# Patient Record
Sex: Female | Born: 1960
Health system: Southern US, Community
[De-identification: ages and names within clinical notes are randomized; demographics above are authoritative.]

## PROBLEM LIST (undated history)

## (undated) DIAGNOSIS — M792 Neuralgia and neuritis, unspecified: Secondary | ICD-10-CM

## (undated) DIAGNOSIS — I10 Essential (primary) hypertension: Secondary | ICD-10-CM

## (undated) DIAGNOSIS — E7800 Pure hypercholesterolemia, unspecified: Secondary | ICD-10-CM

## (undated) HISTORY — PX: FOOT SURGERY: SHX648

## (undated) HISTORY — PX: OTHER SURGICAL HISTORY: SHX169

## (undated) HISTORY — PX: ABDOMINAL HYSTERECTOMY: SHX81

## (undated) HISTORY — PX: NECK SURGERY: SHX720

---

## 2009-05-22 ENCOUNTER — Encounter: Admission: RE | Admit: 2009-05-22 | Discharge: 2009-05-22 | Payer: Self-pay | Admitting: Orthopedic Surgery

## 2009-06-05 ENCOUNTER — Encounter: Admission: RE | Admit: 2009-06-05 | Discharge: 2009-06-05 | Payer: Self-pay | Admitting: Orthopedic Surgery

## 2009-08-29 ENCOUNTER — Encounter: Admission: RE | Admit: 2009-08-29 | Discharge: 2009-08-29 | Payer: Self-pay | Admitting: Orthopedic Surgery

## 2010-04-27 ENCOUNTER — Encounter: Payer: Self-pay | Admitting: Orthopedic Surgery

## 2010-06-03 ENCOUNTER — Other Ambulatory Visit (HOSPITAL_COMMUNITY): Payer: Self-pay

## 2010-06-04 ENCOUNTER — Other Ambulatory Visit (HOSPITAL_COMMUNITY): Payer: Self-pay

## 2010-06-05 ENCOUNTER — Other Ambulatory Visit (HOSPITAL_COMMUNITY): Payer: Self-pay

## 2010-06-06 ENCOUNTER — Ambulatory Visit (HOSPITAL_COMMUNITY): Admission: RE | Admit: 2010-06-06 | Payer: Self-pay | Source: Ambulatory Visit | Admitting: Neurosurgery

## 2015-09-01 ENCOUNTER — Emergency Department (HOSPITAL_COMMUNITY)
Admission: EM | Admit: 2015-09-01 | Discharge: 2015-09-02 | Disposition: A | Discharge status: Home / Self Care | Payer: Medicare Other | Attending: Emergency Medicine | Admitting: Emergency Medicine

## 2015-09-01 ENCOUNTER — Encounter (HOSPITAL_COMMUNITY): Payer: Self-pay | Admitting: *Deleted

## 2015-09-01 ENCOUNTER — Emergency Department (HOSPITAL_COMMUNITY): Payer: Medicare Other

## 2015-09-01 DIAGNOSIS — Y999 Unspecified external cause status: Secondary | ICD-10-CM | POA: Insufficient documentation

## 2015-09-01 DIAGNOSIS — Z87891 Personal history of nicotine dependence: Secondary | ICD-10-CM | POA: Insufficient documentation

## 2015-09-01 DIAGNOSIS — Y9389 Activity, other specified: Secondary | ICD-10-CM | POA: Diagnosis not present

## 2015-09-01 DIAGNOSIS — S92352A Displaced fracture of fifth metatarsal bone, left foot, initial encounter for closed fracture: Secondary | ICD-10-CM | POA: Insufficient documentation

## 2015-09-01 DIAGNOSIS — Y929 Unspecified place or not applicable: Secondary | ICD-10-CM | POA: Diagnosis not present

## 2015-09-01 DIAGNOSIS — I10 Essential (primary) hypertension: Secondary | ICD-10-CM | POA: Diagnosis not present

## 2015-09-01 DIAGNOSIS — S99922A Unspecified injury of left foot, initial encounter: Secondary | ICD-10-CM | POA: Diagnosis present

## 2015-09-01 DIAGNOSIS — W109XXA Fall (on) (from) unspecified stairs and steps, initial encounter: Secondary | ICD-10-CM | POA: Diagnosis not present

## 2015-09-01 DIAGNOSIS — S92302A Fracture of unspecified metatarsal bone(s), left foot, initial encounter for closed fracture: Secondary | ICD-10-CM

## 2015-09-01 HISTORY — DX: Essential (primary) hypertension: I10

## 2015-09-01 HISTORY — DX: Pure hypercholesterolemia, unspecified: E78.00

## 2015-09-01 HISTORY — DX: Neuralgia and neuritis, unspecified: M79.2

## 2015-09-01 NOTE — ED Notes (Signed)
Pt fell hurting her left ankle; ankle is swollen

## 2015-09-02 ENCOUNTER — Emergency Department (HOSPITAL_COMMUNITY): Payer: Medicare Other

## 2015-09-02 MED ORDER — OXYCODONE-ACETAMINOPHEN 5-325 MG PO TABS: 1.0000 | ORAL_TABLET | ORAL | Status: DC | PRN

## 2015-09-02 MED ORDER — IBUPROFEN 800 MG PO TABS: 800.0000 mg | ORAL_TABLET | Freq: Three times a day (TID) | ORAL | Status: DC

## 2015-09-02 NOTE — ED Provider Notes (Signed)
CSN: [id number]     Arrival date & time 09/01/15  2304 History   First MD Initiated Contact with Patient 09/01/15 2358     Chief Complaint  Patient presents with  . Fall     (Consider location/radiation/quality/duration/timing/severity/associated sxs/prior Treatment) HPI  Stefanie Sanders is a 55 y.o. female who presents to the Emergency Department complaining of left ankle and foot pain of sudden onset earlier tonight.  She states that she was trying to break up a fight between two of her dogs and accidentally fell off a step, possibly twisting her ankle.  She reports immediate pain and swelling to the outer ankle and lateral foot.  Unable to bear weight due to pain.  She has applied ice packs with minimal relief.  She denies head injury, neck or back pain, numbness or weakness of the extremity.     Past Medical History  Diagnosis Date  . Hypertension   . Hypercholesteremia   . Neurological pain disorder    Past Surgical History  Procedure Laterality Date  . Foot surgery Left   . Abdominal hysterectomy     History reviewed. No pertinent family history. Social History  Substance Use Topics  . Smoking status: Former Research scientist (life sciences)  . Smokeless tobacco: None  . Alcohol Use: Yes     Comment: occasionally   OB History    No data available     Review of Systems  Constitutional: Negative for fever and chills.  Musculoskeletal: Positive for joint swelling and arthralgias (left ankle and foot pain). Negative for back pain and neck pain.  Skin: Negative for color change and wound.  Neurological: Negative for weakness and numbness.  All other systems reviewed and are negative.     Allergies  Other  Home Medications   Prior to Admission medications   Not on File   BP 180/86 mmHg  Pulse 83  Temp(Src) 98.1 F (36.7 C) (Oral)  Resp 20  Ht 5\' 6"  (1.676 m)  Wt 70.308 kg  BMI 25.03 kg/m2  SpO2 100% Physical Exam  Constitutional: She is oriented to person, place, and time. She  appears well-developed and well-nourished. No distress.  HENT:  Head: Normocephalic and atraumatic.  Cardiovascular: Normal rate, regular rhythm, normal heart sounds and intact distal pulses.   Pulmonary/Chest: Effort normal and breath sounds normal. No respiratory distress.  Musculoskeletal: She exhibits edema and tenderness.  ttp and moderate edema of the lateral left ankle and lateral foot.  Ecchymosis of the lateral foot. DP pulse is brisk,distal sensation intact.  No erythema, abrasion, or bony deformity.  Compartments soft.  Pt has full ROM of the left knee.    Neurological: She is alert and oriented to person, place, and time. She exhibits normal muscle tone. Coordination normal.  Skin: Skin is warm and dry.  Nursing note and vitals reviewed.   ED Course  Procedures (including critical care time) Labs Review Labs Reviewed - No data to display  Imaging Review Dg Ankle Complete Left  09/02/2015  CLINICAL DATA:  Initial valuation for acute trauma, fall. EXAM: LEFT ANKLE COMPLETE - 3+ VIEW COMPARISON:  None. FINDINGS: No acute fracture or dislocation. Ankle mortise approximated. Talar dome intact. Soft tissue swelling present at the lateral malleolus. Joint effusion. Posterior calcaneal enthesophyte noted. Degenerative spurring at the dorsal midfoot. IMPRESSION: 1. No acute fracture or dislocation. 2. Soft tissue swelling of the lateral malleolus with joint effusion. Electronically Signed   By: Jeannine Boga M.D.   On: 09/02/2015 00:06  Dg Foot Complete Left  09/02/2015  CLINICAL DATA:  Left lateral foot pain and swelling. Dorsal foot pain. Injury. EXAM: LEFT FOOT - COMPLETE 3+ VIEW COMPARISON:  Ankle radiographs 3 hours prior. FINDINGS: Minimally displaced fracture base of fifth metatarsal extending to the tarsal metatarsal joint. No additional acute fracture of the foot. Mild hammertoe deformity of the digits, alignment is otherwise maintained. There is lateral soft tissue edema.  IMPRESSION: Minimally displaced fracture base of the fifth metatarsal extending to the tarsal metatarsal joint. Electronically Signed   By: Jeb Levering M.D.   On: 09/02/2015 01:05   I have personally reviewed and evaluated these images and lab results as part of my medical decision-making.   EKG Interpretation None      MDM   Final diagnoses:  Fx metatarsal, left, closed, initial encounter    Pt with minimally displaced fx base of 5th MT.  No open fx.  NV intact.  Discussed results with pt.  Agrees to close orthopedic f/u, cam walker applied.  Pt has crutches at home.  Referral info given    Kem Parkinson, PA-C 09/02/15 0158  Varney Biles, MD 09/03/15 [phone]

## 2015-09-02 NOTE — Discharge Instructions (Signed)
Metatarsal Fracture A metatarsal fracture is a break in a metatarsal bone. Metatarsal bones connect your toe bones to your ankle bones. CAUSES This type of fracture may be caused by:  A sudden twisting of your foot.  A fall onto your foot.  Overuse or repetitive exercise. RISK FACTORS This condition is more likely to develop in people who:  Play contact sports.  Have a bone disease.  Have a low calcium level. SYMPTOMS Symptoms of this condition include:  Pain that is worse when walking or standing.  Pain when pressing on the foot or moving the toes.  Swelling.  Bruising on the top or bottom of the foot.  A foot that appears shorter than the other one. DIAGNOSIS This condition is diagnosed with a physical exam. You may also have imaging tests, such as:  X-rays.  A CT scan.  MRI. TREATMENT Treatment for this condition depends on its severity and whether a bone has moved out of place. Treatment may involve:  Rest.  Wearing foot support such as a cast, splint, or boot for several weeks.  Using crutches.  Surgery to move bones back into the right position. Surgery is usually needed if there are many pieces of broken bone or bones that are very out of place (displaced fracture).  Physical therapy. This may be needed to help you regain full movement and strength in your foot. You will need to return to your health care provider to have X-rays taken until your bones heal. Your health care provider will look at the X-rays to make sure that your foot is healing well. HOME CARE INSTRUCTIONS  If You Have a Cast:  Do not stick anything inside the cast to scratch your skin. Doing that increases your risk of infection.  Check the skin around the cast every day. Report any concerns to your health care provider. You may put lotion on dry skin around the edges of the cast. Do not apply lotion to the skin underneath the cast.  Keep the cast clean and dry. If You Have a Splint  or a Supportive Boot:  Wear it as directed by your health care provider. Remove it only as directed by your health care provider.  Loosen it if your toes become numb and tingle, or if they turn cold and blue.  Keep it clean and dry. Bathing  Do not take baths, swim, or use a hot tub until your health care provider approves. Ask your health care provider if you can take showers. You may only be allowed to take sponge baths for bathing.  If your health care provider approves bathing and showering, cover the cast or splint with a watertight plastic bag to protect it from water. Do not let the cast or splint get wet. Managing Pain, Stiffness, and Swelling  If directed, apply ice to the injured area (if you have a splint, not a cast).  Put ice in a plastic bag.  Place a towel between your skin and the bag.  Leave the ice on for 20 minutes, 2-3 times per day.  Move your toes often to avoid stiffness and to lessen swelling.  Raise (elevate) the injured area above the level of your heart while you are sitting or lying down. Driving  Do not drive or operate heavy machinery while taking pain medicine.  Do not drive while wearing foot support on a foot that you use for driving. Activity  Return to your normal activities as directed by your health care   provider. Ask your health care provider what activities are safe for you.  Perform exercises as directed by your health care provider or physical therapist. Safety  Do not use the injured foot to support your body weight until your health care provider says that you can. Use crutches as directed by your health care provider. General Instructions  Do not put pressure on any part of the cast or splint until it is fully hardened. This may take several hours.  Do not use any tobacco products, including cigarettes, chewing tobacco, or e-cigarettes. Tobacco can delay bone healing. If you need help quitting, ask your health care  provider.  Take medicines only as directed by your health care provider.  Keep all follow-up visits as directed by your health care provider. This is important. SEEK MEDICAL CARE IF:  You have a fever.  Your cast, splint, or boot is too loose or too tight.  Your cast, splint, or boot is damaged.  Your pain medicine is not helping.  You have pain, tingling, or numbness in your foot that is not going away. SEEK IMMEDIATE MEDICAL CARE IF:  You have severe pain.  You have tingling or numbness in your foot that is getting worse.  Your foot feels cold or becomes numb.  Your foot changes color.   This information is not intended to replace advice given to you by your health care provider. Make sure you discuss any questions you have with your health care provider.   Document Released: 12/13/2001 Document Revised: 08/07/2014 Document Reviewed: 01/17/2014 Elsevier Interactive Patient Education 2016 [employer]  

## 2015-09-02 NOTE — ED Notes (Signed)
Pt's left foot elevated with pillows and pt given an ice pack.

## 2015-09-04 ENCOUNTER — Encounter: Payer: Self-pay | Admitting: Orthopaedic Surgery

## 2015-09-04 ENCOUNTER — Ambulatory Visit (INDEPENDENT_AMBULATORY_CARE_PROVIDER_SITE_OTHER): Payer: Medicare Other | Admitting: Orthopaedic Surgery

## 2015-09-04 VITALS — BP 146/80 | HR 68 | Temp 97.7°F | Ht 66.0 in

## 2015-09-04 DIAGNOSIS — S92302A Fracture of unspecified metatarsal bone(s), left foot, initial encounter for closed fracture: Secondary | ICD-10-CM | POA: Diagnosis not present

## 2015-09-04 MED ORDER — HYDROCODONE-ACETAMINOPHEN 7.5-325 MG PO TABS
1.0000 | ORAL_TABLET | ORAL | Status: DC | PRN
Start: 2015-09-04 — End: 2015-10-01

## 2015-09-04 NOTE — Patient Instructions (Addendum)
Contrast baths sheet given and explained to the patient. Continue to wear the CAM walker given to her in the ED.  X-ray left foot (5th MT fx) on return.

## 2015-09-04 NOTE — Progress Notes (Signed)
Subjective: I broke my right foot    Patient ID: Stefanie Sanders, female    DOB: Nov 23, 1960, 55 y.o.   MRN: [id number]  Foot Injury  The incident occurred 3 to 5 days ago. The incident occurred at home. The injury mechanism was a fall. The pain is present in the left foot. The quality of the pain is described as aching. The pain is at a severity of 4/10. The pain is moderate. The pain has been improving since onset. The symptoms are aggravated by weight bearing. She has tried elevation, ice, immobilization and rest for the symptoms. The treatment provided moderate relief.   She hurt her left foot May 28 when walking her dogs.  She fell and hurt the left lateral foot.  She was seen in the ER.  X-rays showed a base of the fifth metatarsal fracture. She had no other injury.  I have reviewed the ER notes, the x-rays and the x-ray report.  She has a CAM walker and crutches.  She was given pain medicicine.   Review of Systems  HENT: Negative for congestion.   Respiratory: Negative for cough and shortness of breath.   Cardiovascular: Negative for chest pain and leg swelling.  Endocrine: Negative for cold intolerance.  Musculoskeletal: Positive for joint swelling, arthralgias and gait problem.  Allergic/Immunologic: Negative for environmental allergies.   Past Medical History  Diagnosis Date  . Hypertension   . Hypercholesteremia   . Neurological pain disorder     Past Surgical History  Procedure Laterality Date  . Foot surgery Left   . Abdominal hysterectomy      Current Outpatient Prescriptions on File Prior to Visit  Medication Sig Dispense Refill  . ibuprofen (ADVIL,MOTRIN) 800 MG tablet Take 1 tablet (800 mg total) by mouth 3 (three) times daily. Take with food 21 tablet 0  . oxyCODONE-acetaminophen (PERCOCET/ROXICET) 5-325 MG tablet Take 1 tablet by mouth every 4 (four) hours as needed. 6 tablet 0  . oxyCODONE-acetaminophen (PERCOCET/ROXICET) 5-325 MG tablet Take 1 tablet by  mouth every 4 (four) hours as needed. (Patient not taking: Reported on 09/04/2015) 6 tablet 0   No current facility-administered medications on file prior to visit.    Social History   Social History  . Marital Status: Married    Spouse Name: N/A  . Number of Children: N/A  . Years of Education: N/A   Occupational History  . Not on file.   Social History Main Topics  . Smoking status: Former Research scientist (life sciences)  . Smokeless tobacco: Not on file  . Alcohol Use: Yes     Comment: occasionally  . Drug Use: No  . Sexual Activity: Yes    Birth Control/ Protection: Surgical   Other Topics Concern  . Not on file   Social History Narrative    BP 146/80 mmHg  Pulse 68  Temp(Src) 97.7 F (36.5 C)  Ht 5\' 6"  (1.676 m)      Objective:   Physical Exam  Constitutional: She is oriented to person, place, and time. She appears well-developed and well-nourished.  HENT:  Head: Normocephalic and atraumatic.  Eyes: Conjunctivae and EOM are normal. Pupils are equal, round, and reactive to light.  Neck: Normal range of motion. Neck supple.  Cardiovascular: Normal rate, regular rhythm and intact distal pulses.   Pulmonary/Chest: Effort normal.  Abdominal: Soft.  Musculoskeletal: She exhibits tenderness (Pain of the left lateral foot base of the fifth metatarsal with ecchymosis and swelling of the ankle, more laterally.  NV  intact.  Right ankle and foot normall.).       Left foot: There is decreased range of motion, tenderness, bony tenderness and swelling.       Feet:  Neurological: She is alert and oriented to person, place, and time. She displays normal reflexes. No cranial nerve deficit. She exhibits normal muscle tone. Coordination normal.  Skin: Skin is warm and dry.  Psychiatric: She has a normal mood and affect. Her behavior is normal. Judgment and thought content normal.          Assessment & Plan:   Encounter Diagnosis  Name Primary?  Marland Kitchen Fx metatarsal, left, closed, initial  encounter Yes   Sheet of instructions for Contrast Baths given.  Elevate, Ice, Use CAM walker, crutches.  Return in two weeks.  X-rays of the left foot on return.  Call if any problem.  Precautions and pain medicine given.

## 2015-09-13 MED FILL — Oxycodone w/ Acetaminophen Tab 5-325 MG: ORAL | Qty: 6 | Status: AC

## 2015-09-17 ENCOUNTER — Ambulatory Visit: Payer: Medicare Other | Admitting: Orthopaedic Surgery

## 2015-09-18 ENCOUNTER — Ambulatory Visit (INDEPENDENT_AMBULATORY_CARE_PROVIDER_SITE_OTHER): Payer: Medicare Other

## 2015-09-18 ENCOUNTER — Encounter: Payer: Self-pay | Admitting: Orthopaedic Surgery

## 2015-09-18 ENCOUNTER — Ambulatory Visit: Payer: Medicare Other | Admitting: Orthopaedic Surgery

## 2015-09-18 VITALS — BP 154/83 | HR 67 | Temp 97.7°F | Ht 66.0 in | Wt 160.6 lb

## 2015-09-18 DIAGNOSIS — S92302G Fracture of unspecified metatarsal bone(s), left foot, subsequent encounter for fracture with delayed healing: Secondary | ICD-10-CM

## 2015-09-18 NOTE — Patient Instructions (Signed)
Precautions discussed.  X-ray on return

## 2015-09-18 NOTE — Progress Notes (Signed)
CC:  My foot is better  Her left foot is less tender and painful.  She has been using the CAM walker.  NV is intact.  She has no swelling.  X-rays were done, reported separately.  Encounter Diagnosis  Name Primary?  Marland Kitchen Fx metatarsal-closed, left, with delayed healing, subsequent encounter Yes    Come out of the CAM walker in the house.  Use it in public.  Come out of it in about two weeks.  Return in one month.  X-rays on return left foot.  Call if any problems.  Precautions discussed.  Electronically Signed Sanjuana Kava, MD 6/14/201710:00 AM

## 2015-10-01 ENCOUNTER — Encounter: Payer: Self-pay | Admitting: Orthopaedic Surgery

## 2015-10-01 ENCOUNTER — Ambulatory Visit (INDEPENDENT_AMBULATORY_CARE_PROVIDER_SITE_OTHER): Payer: Medicare Other

## 2015-10-01 ENCOUNTER — Ambulatory Visit (INDEPENDENT_AMBULATORY_CARE_PROVIDER_SITE_OTHER): Payer: Medicare Other | Admitting: Orthopaedic Surgery

## 2015-10-01 VITALS — BP 154/91 | HR 67 | Temp 97.9°F | Ht 66.0 in | Wt 166.0 lb

## 2015-10-01 DIAGNOSIS — S99922D Unspecified injury of left foot, subsequent encounter: Secondary | ICD-10-CM

## 2015-10-01 DIAGNOSIS — S92302D Fracture of unspecified metatarsal bone(s), left foot, subsequent encounter for fracture with routine healing: Secondary | ICD-10-CM

## 2015-10-01 MED ORDER — HYDROCODONE-ACETAMINOPHEN 7.5-325 MG PO TABS: 1.0000 | ORAL_TABLET | ORAL | Status: DC | PRN

## 2015-10-01 NOTE — Addendum Note (Signed)
Addended by: Willette Pa on: 10/01/2015 10:49 AM   Modules accepted: Orders

## 2015-10-01 NOTE — Progress Notes (Signed)
CC:  My left foot got hurt again  She had come out of the CAM walker and was doing some exercises and she slipped and re-injured the left foot again.  She has more pain and some swelling.  X-rays show no change.  NV intact.  No redness or deformity.  Encounter Diagnoses  Name Primary?  . Injury of left foot, subsequent encounter Yes  . Fracture, metatarsal, open, left, with routine healing, subsequent encounter     Return in one month  X-rays on return  Come out of the CAM walker as tolerated.  Call if any problem.  Electronically Signed Sanjuana Kava, MD 6/27/201710:48 AM

## 2015-10-22 ENCOUNTER — Ambulatory Visit: Payer: Medicare Other | Admitting: Orthopaedic Surgery

## 2015-10-28 ENCOUNTER — Other Ambulatory Visit: Payer: Self-pay | Admitting: Radiology

## 2015-10-29 ENCOUNTER — Encounter: Payer: Self-pay | Admitting: Orthopaedic Surgery

## 2015-10-29 ENCOUNTER — Ambulatory Visit (HOSPITAL_COMMUNITY)
Admission: RE | Admit: 2015-10-29 | Discharge: 2015-10-29 | Disposition: A | Discharge status: Home / Self Care | Payer: Medicare Other | Source: Ambulatory Visit | Attending: Orthopaedic Surgery | Admitting: Orthopaedic Surgery

## 2015-10-29 ENCOUNTER — Ambulatory Visit: Payer: Medicare Other | Admitting: Orthopaedic Surgery

## 2015-10-29 VITALS — BP 145/80 | HR 76 | Ht 66.0 in | Wt 158.4 lb

## 2015-10-29 DIAGNOSIS — X58XXXD Exposure to other specified factors, subsequent encounter: Secondary | ICD-10-CM | POA: Insufficient documentation

## 2015-10-29 DIAGNOSIS — S92302D Fracture of unspecified metatarsal bone(s), left foot, subsequent encounter for fracture with routine healing: Secondary | ICD-10-CM | POA: Insufficient documentation

## 2015-10-29 DIAGNOSIS — S92302G Fracture of unspecified metatarsal bone(s), left foot, subsequent encounter for fracture with delayed healing: Secondary | ICD-10-CM

## 2015-10-29 MED ORDER — SODIUM CHLORIDE 0.9% FLUSH
INTRAVENOUS | Status: AC
  Filled 2015-10-29: qty 10

## 2015-10-29 NOTE — Progress Notes (Signed)
CC:  My foot does not hurt  Her left foot has no pain. She is walking on the track daily.  She has been to the beach.  All with no problem and no difficulty.  NV intact.  ROM full, gait normal.  Healed fracture left metatarsal  Plan:  Discharge.  Call if any problem.

## 2016-07-28 IMAGING — DX DG FOOT COMPLETE 3+V*L*
3 series · 3 of 3 positions shown · non-contrast
Comparison: 09/02/2015

CLINICAL DATA: Follow-up fifth metatarsal fracture. Persistent
lateral foot pain. Subsequent encounter.

EXAM:
LEFT FOOT - COMPLETE 3+ VIEW

[foot ap]
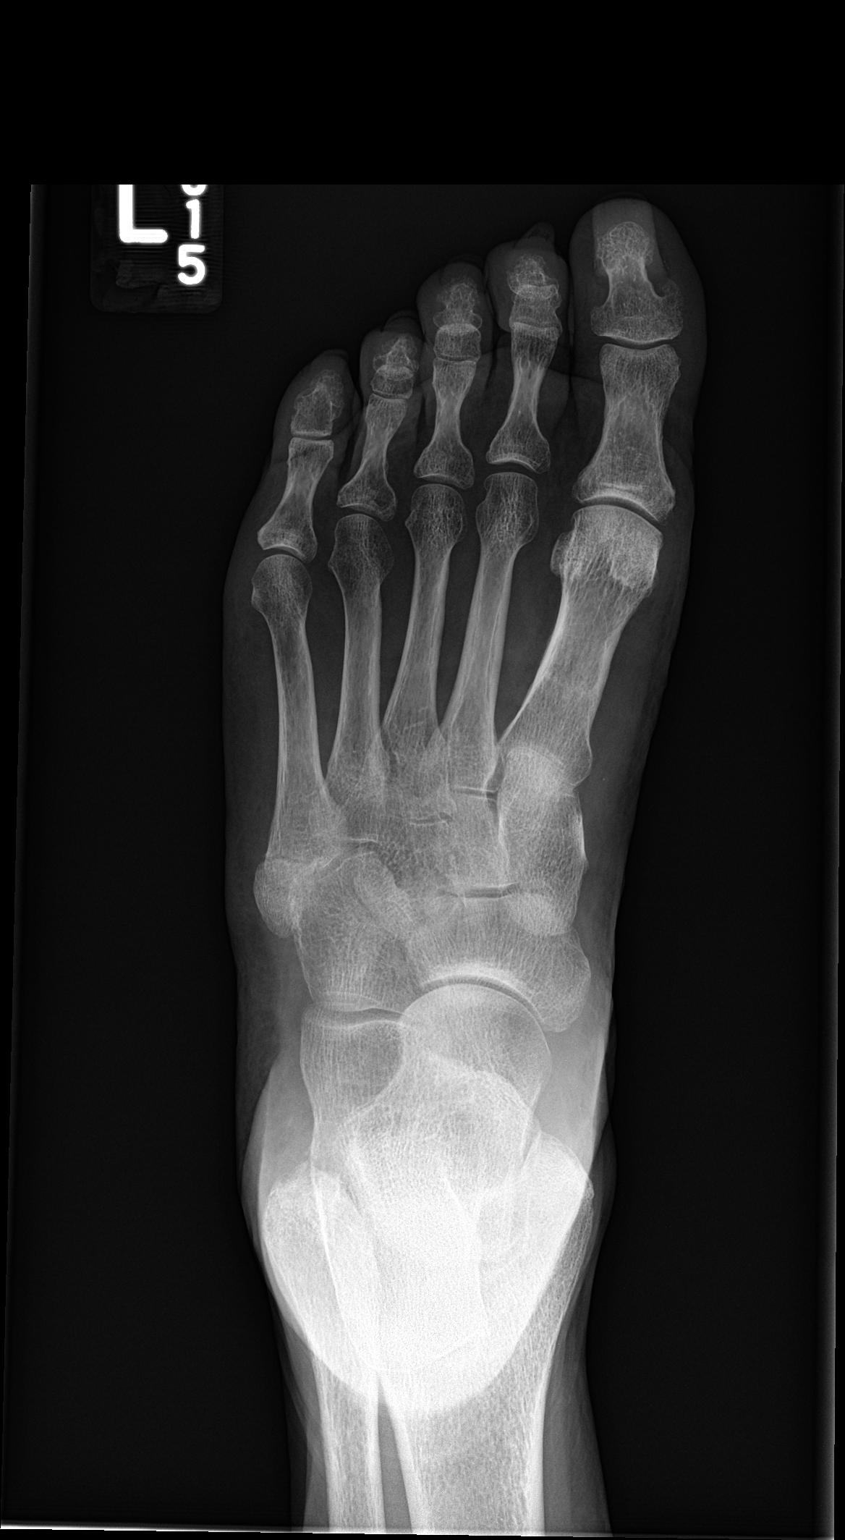

[foot obl]
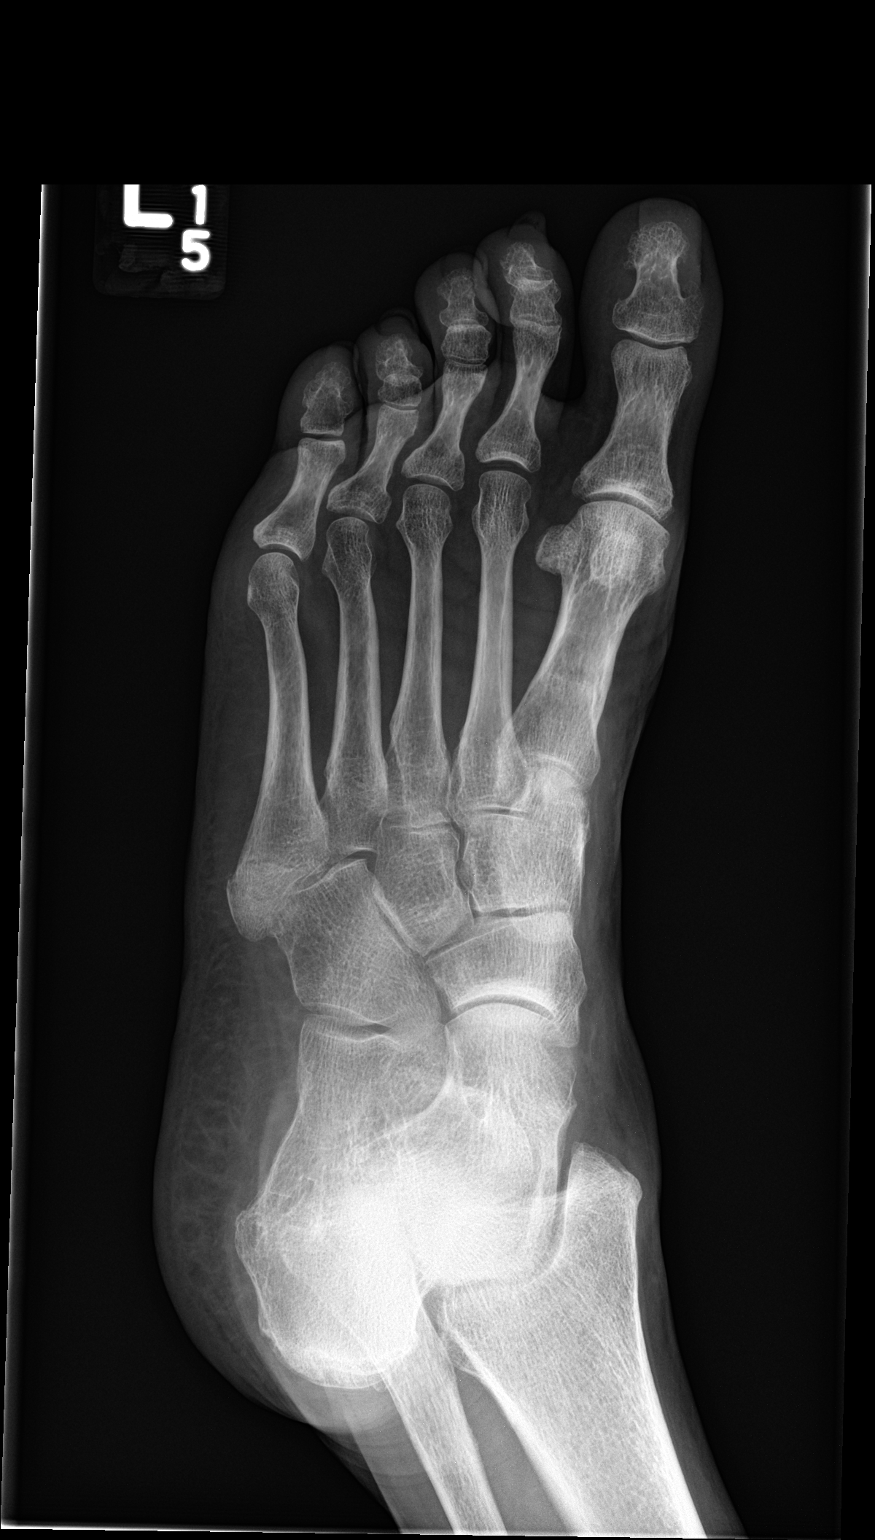

[foot lat]
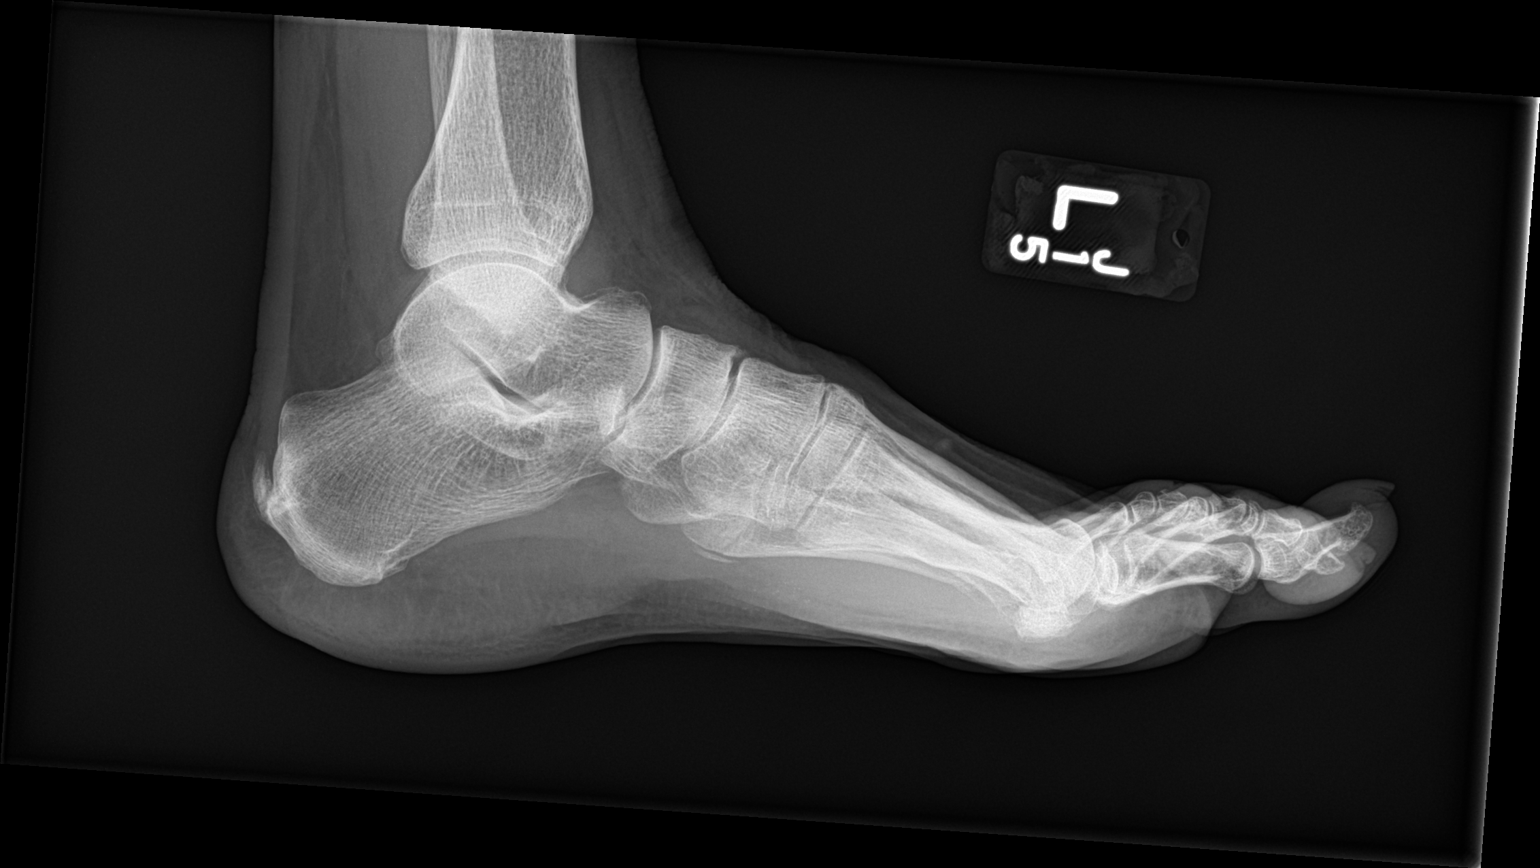

[3 of 3 positions shown; findings below may reference images not displayed]

FINDINGS: Nondisplaced fracture through the base of the fifth metatarsal is
again seen, but shows increased surrounding sclerosis, consistent
with incomplete healing. No other fractures or dislocation. Dorsal
and plantar calcaneal spurs again noted.
IMPRESSION: Incomplete healing of nondisplaced fracture of the fifth metatarsal
base.

## 2017-11-18 ENCOUNTER — Encounter (INDEPENDENT_AMBULATORY_CARE_PROVIDER_SITE_OTHER): Payer: Self-pay | Admitting: Internal Medicine

## 2017-11-18 ENCOUNTER — Telehealth (INDEPENDENT_AMBULATORY_CARE_PROVIDER_SITE_OTHER): Payer: Self-pay | Admitting: *Deleted

## 2017-11-18 ENCOUNTER — Encounter (INDEPENDENT_AMBULATORY_CARE_PROVIDER_SITE_OTHER): Payer: Self-pay | Admitting: *Deleted

## 2017-11-18 ENCOUNTER — Ambulatory Visit (INDEPENDENT_AMBULATORY_CARE_PROVIDER_SITE_OTHER): Payer: Medicare Other | Admitting: Internal Medicine

## 2017-11-18 VITALS — BP 140/78 | HR 80 | Temp 98.3°F | Ht 66.0 in | Wt 148.8 lb

## 2017-11-18 DIAGNOSIS — K219 Gastro-esophageal reflux disease without esophagitis: Secondary | ICD-10-CM | POA: Diagnosis not present

## 2017-11-18 DIAGNOSIS — R195 Other fecal abnormalities: Secondary | ICD-10-CM

## 2017-11-18 MED ORDER — SUPREP BOWEL PREP KIT 17.5-3.13-1.6 GM/177ML PO SOLN: 1.0000 | Freq: Once | ORAL | 0 refills | Status: AC

## 2017-11-18 NOTE — Progress Notes (Signed)
   Subjective:    Patient ID: Stefanie Sanders, female    DOB: 10-Oct-1960, 57 y.o.   MRN: [id number]  HPI Referred by Dr.Barrino for GERD, change in bowel habits. 10/06/2017 H. Pylori positive. Treated with antibiotics x 2.  She was having epigastric pain and had spasms in her throat.  She says now she is uncomfortable in her epigastric region. No NSAIDs. She denies acid reflux.  She has frequent burping and flatus.  She says there has been a change in her BMs. Her stools are thin. Sometimes she feels constipated. She does not have a BM daily. No abdominal pain.  Change in her stools since February. Has never had a colonoscopy in the past.  Father died from liver cancer Aunt had kidney cancer. One aunt died from ovarian cancer. One aunt had breast cancer.   Divorced. Disabled. One child age 34 with bad heart    Review of Systems Past Medical History:  Diagnosis Date  . Hypercholesteremia   . Hypertension   . Neurological pain disorder     Past Surgical History:  Procedure Laterality Date  . ABDOMINAL HYSTERECTOMY    . FOOT SURGERY Left     Allergies  Allergen Reactions  . Other     Pt states she is allergic to some type of antibiotic but does not know which one    Current Outpatient Medications on File Prior to Visit  Medication Sig Dispense Refill  . baclofen (LIORESAL) 10 MG tablet Take 10 mg by mouth 3 (three) times daily.    Marland Kitchen dicyclomine (BENTYL) 10 MG capsule Take 20 mg by mouth 3 (three) times daily before meals.     . Latanoprostene Bunod (VYZULTA) 0.024 % SOLN Apply to eye.    . pantoprazole (PROTONIX) 40 MG tablet Take 40 mg by mouth daily.    . pregabalin (LYRICA) 100 MG capsule Take 100 mg by mouth 2 (two) times daily.    Marland Kitchen triamterene-hydrochlorothiazide (MAXZIDE-25) 37.5-25 MG tablet Take 1 tablet by mouth daily.    . Vitamin D, Ergocalciferol, (DRISDOL) 50000 units CAPS capsule Take 50,000 Units by mouth every 7 (seven) days.     No current  facility-administered medications on file prior to visit.         Objective:   Physical Exam  Blood pressure 140/78, pulse 80, temperature 98.3 F (36.8 C), height 5\' 6"  (1.676 m), weight 148 lb 12.8 oz (67.5 kg). Alert and oriented. Skin warm and dry. Oral mucosa is moist.   . Sclera anicteric, conjunctivae is pink. Thyroid not enlarged. No cervical lymphadenopathy. Lungs clear. Heart regular rate and rhythm.  Abdomen is soft. Bowel sounds are positive. No hepatomegaly. No abdominal masses felt. No tenderness.  No edema to lower extremities.             Assessment & Plan:  GERD. Hx of H. Pylori positive.  Needs EGD. Change in stool. Colonic neoplasm needs to be ruled out. EGD/Colonoscopy

## 2017-11-18 NOTE — Patient Instructions (Signed)
The risks of bleeding, perforation and infection were reviewed with patient.  

## 2017-11-18 NOTE — Telephone Encounter (Signed)
Patient needs suprep 

## 2017-12-13 ENCOUNTER — Encounter (HOSPITAL_COMMUNITY): Payer: Self-pay | Admitting: *Deleted

## 2017-12-13 ENCOUNTER — Ambulatory Visit (HOSPITAL_COMMUNITY)
Admission: RE | Admit: 2017-12-13 | Discharge: 2017-12-13 | Disposition: A | Discharge status: Home / Self Care | Payer: Medicare Other | Source: Ambulatory Visit | Attending: Internal Medicine | Admitting: Internal Medicine

## 2017-12-13 ENCOUNTER — Other Ambulatory Visit: Payer: Self-pay

## 2017-12-13 ENCOUNTER — Encounter (HOSPITAL_COMMUNITY)
Admission: RE | Disposition: A | Discharge status: Home / Self Care | Payer: Self-pay | Source: Ambulatory Visit | Attending: Internal Medicine

## 2017-12-13 DIAGNOSIS — I10 Essential (primary) hypertension: Secondary | ICD-10-CM | POA: Diagnosis not present

## 2017-12-13 DIAGNOSIS — K5730 Diverticulosis of large intestine without perforation or abscess without bleeding: Secondary | ICD-10-CM | POA: Insufficient documentation

## 2017-12-13 DIAGNOSIS — K644 Residual hemorrhoidal skin tags: Secondary | ICD-10-CM | POA: Diagnosis not present

## 2017-12-13 DIAGNOSIS — E7800 Pure hypercholesterolemia, unspecified: Secondary | ICD-10-CM | POA: Diagnosis not present

## 2017-12-13 DIAGNOSIS — K219 Gastro-esophageal reflux disease without esophagitis: Secondary | ICD-10-CM | POA: Insufficient documentation

## 2017-12-13 DIAGNOSIS — K2940 Chronic atrophic gastritis without bleeding: Secondary | ICD-10-CM | POA: Diagnosis not present

## 2017-12-13 DIAGNOSIS — D120 Benign neoplasm of cecum: Secondary | ICD-10-CM | POA: Diagnosis not present

## 2017-12-13 DIAGNOSIS — Z79899 Other long term (current) drug therapy: Secondary | ICD-10-CM | POA: Insufficient documentation

## 2017-12-13 DIAGNOSIS — Z880 Allergy status to penicillin: Secondary | ICD-10-CM | POA: Diagnosis not present

## 2017-12-13 DIAGNOSIS — R195 Other fecal abnormalities: Secondary | ICD-10-CM

## 2017-12-13 DIAGNOSIS — K2970 Gastritis, unspecified, without bleeding: Secondary | ICD-10-CM | POA: Diagnosis not present

## 2017-12-13 DIAGNOSIS — F17210 Nicotine dependence, cigarettes, uncomplicated: Secondary | ICD-10-CM | POA: Insufficient documentation

## 2017-12-13 DIAGNOSIS — R194 Change in bowel habit: Secondary | ICD-10-CM | POA: Diagnosis not present

## 2017-12-13 DIAGNOSIS — K2950 Unspecified chronic gastritis without bleeding: Secondary | ICD-10-CM | POA: Diagnosis not present

## 2017-12-13 HISTORY — PX: BIOPSY: SHX5522

## 2017-12-13 HISTORY — PX: COLONOSCOPY: SHX5424

## 2017-12-13 HISTORY — PX: POLYPECTOMY: SHX5525

## 2017-12-13 HISTORY — PX: ESOPHAGOGASTRODUODENOSCOPY: SHX5428

## 2017-12-13 SURGERY — EGD (ESOPHAGOGASTRODUODENOSCOPY)
Anesthesia: Moderate Sedation

## 2017-12-13 MED ORDER — FAMOTIDINE 20 MG PO TABS: 20.0000 mg | ORAL_TABLET | Freq: Every day | ORAL | Status: DC

## 2017-12-13 MED ORDER — STERILE WATER FOR IRRIGATION IR SOLN
Status: DC | PRN
  Administered 2017-12-13: 2.5 mL

## 2017-12-13 MED ORDER — MIDAZOLAM HCL 5 MG/5ML IJ SOLN
INTRAMUSCULAR | Status: DC | PRN
  Administered 2017-12-13: 2 mg via INTRAVENOUS
  Administered 2017-12-13 (×2): 1 mg via INTRAVENOUS
  Administered 2017-12-13: 2 mg via INTRAVENOUS

## 2017-12-13 MED ORDER — SODIUM CHLORIDE 0.9 % IV SOLN
INTRAVENOUS | Status: DC
  Administered 2017-12-13: 08:00:00 via INTRAVENOUS

## 2017-12-13 MED ORDER — MEPERIDINE HCL 50 MG/ML IJ SOLN
INTRAMUSCULAR | Status: DC | PRN
  Administered 2017-12-13 (×2): 25 mg via INTRAVENOUS

## 2017-12-13 MED ORDER — BENEFIBER DRINK MIX PO PACK: 4.0000 g | PACK | Freq: Every day | ORAL | Status: DC

## 2017-12-13 MED ORDER — LIDOCAINE VISCOUS HCL 2 % MT SOLN
OROMUCOSAL | Status: AC
  Filled 2017-12-13: qty 15

## 2017-12-13 MED ORDER — MIDAZOLAM HCL 5 MG/5ML IJ SOLN
INTRAMUSCULAR | Status: AC
  Filled 2017-12-13: qty 10

## 2017-12-13 MED ORDER — LIDOCAINE VISCOUS HCL 2 % MT SOLN
OROMUCOSAL | Status: DC | PRN
  Administered 2017-12-13: 4 mL via OROMUCOSAL

## 2017-12-13 MED ORDER — MEPERIDINE HCL 50 MG/ML IJ SOLN
INTRAMUSCULAR | Status: AC
  Filled 2017-12-13: qty 1

## 2017-12-13 NOTE — Discharge Instructions (Signed)
Diverticulosis Diverticulosis is a condition that develops when small pouches (diverticula) form in the wall of the large intestine (colon). The colon is where water is absorbed and stool is formed. The pouches form when the inside layer of the colon pushes through weak spots in the outer layers of the colon. You may have a few pouches or many of them. What are the causes? The cause of this condition is not known. What increases the risk? The following factors may make you more likely to develop this condition:  Being older than age 27. Your risk for this condition increases with age. Diverticulosis is rare among people younger than age 36. By age 28, many people have it.  Eating a low-fiber diet.  Having frequent constipation.  Being overweight.  Not getting enough exercise.  Smoking.  Taking over-the-counter pain medicines, like aspirin and ibuprofen.  Having a family history of diverticulosis.  What are the signs or symptoms? In most people, there are no symptoms of this condition. If you do have symptoms, they may include:  Bloating.  Cramps in the abdomen.  Constipation or diarrhea.  Pain in the lower left side of the abdomen.  How is this diagnosed? This condition is most often diagnosed during an exam for other colon problems. Because diverticulosis usually has no symptoms, it often cannot be diagnosed independently. This condition may be diagnosed by:  Using a flexible scope to examine the colon (colonoscopy).  Taking an X-ray of the colon after dye has been put into the colon (barium enema).  Doing a CT scan.  How is this treated? You may not need treatment for this condition if you have never developed an infection related to diverticulosis. If you have had an infection before, treatment may include:  Eating a high-fiber diet. This may include eating more fruits, vegetables, and grains.  Taking a fiber supplement.  Taking a live bacteria supplement  (probiotic).  Taking medicine to relax your colon.  Taking antibiotic medicines.  Follow these instructions at home:  Drink 6-8 glasses of water or more each day to prevent constipation.  Try not to strain when you have a bowel movement.  If you have had an infection before: ? Eat more fiber as directed by your health care provider or your diet and nutrition specialist (dietitian). ? Take a fiber supplement or probiotic, if your health care provider approves.  Take over-the-counter and prescription medicines only as told by your health care provider.  If you were prescribed an antibiotic, take it as told by your health care provider. Do not stop taking the antibiotic even if you start to feel better.  Keep all follow-up visits as told by your health care provider. This is important. Contact a health care provider if:  You have pain in your abdomen.  You have bloating.  You have cramps.  You have not had a bowel movement in 3 days. Get help right away if:  Your pain gets worse.  Your bloating becomes very bad.  You have a fever or chills, and your symptoms suddenly get worse.  You vomit.  You have bowel movements that are bloody or black.  You have bleeding from your rectum. Summary  Diverticulosis is a condition that develops when small pouches (diverticula) form in the wall of the large intestine (colon).  You may have a few pouches or many of them.  This condition is most often diagnosed during an exam for other colon problems.  If you have had an  infection related to diverticulosis, treatment may include increasing the fiber in your diet, taking supplements, or taking medicines. This information is not intended to replace advice given to you by your health care provider. Make sure you discuss any questions you have with your health care provider. Document Released: 12/19/2003 Document Revised: 02/10/2016 Document Reviewed: 02/10/2016 Elsevier Interactive  Patient Education  2017 [place]. Colonoscopy, Adult, Care After This sheet gives you information about how to care for yourself after your procedure. Your health care provider may also give you more specific instructions. If you have problems or questions, contact your health care provider. What can I expect after the procedure? After the procedure, it is common to have:  A small amount of blood in your stool for 24 hours after the procedure.  Some gas.  Mild abdominal cramping or bloating.  Follow these instructions at home: General instructions   For the first 24 hours after the procedure: ? Do not drive or use machinery. ? Do not sign important documents. ? Do not drink alcohol. ? Do your regular daily activities at a slower pace than normal. ? Eat soft, easy-to-digest foods. ? Rest often.  Take over-the-counter or prescription medicines only as told by your health care provider.  It is up to you to get the results of your procedure. Ask your health care provider, or the department performing the procedure, when your results will be ready. Relieving cramping and bloating  Try walking around when you have cramps or feel bloated.  Apply heat to your abdomen as told by your health care provider. Use a heat source that your health care provider recommends, such as a moist heat pack or a heating pad. ? Place a towel between your skin and the heat source. ? Leave the heat on for 20-30 minutes. ? Remove the heat if your skin turns bright red. This is especially important if you are unable to feel pain, heat, or cold. You may have a greater risk of getting burned. Eating and drinking  Drink enough fluid to keep your urine clear or pale yellow.  Resume your normal diet as instructed by your health care provider. Avoid heavy or fried foods that are hard to digest.  Avoid drinking alcohol for as long as instructed by your health care provider. Contact a health care provider  if:  You have blood in your stool 2-3 days after the procedure. Get help right away if:  You have more than a small spotting of blood in your stool.  You pass large blood clots in your stool.  Your abdomen is swollen.  You have nausea or vomiting.  You have a fever.  You have increasing abdominal pain that is not relieved with medicine. This information is not intended to replace advice given to you by your health care provider. Make sure you discuss any questions you have with your health care provider. Document Released: 11/05/2003 Document Revised: 12/16/2015 Document Reviewed: 06/04/2015 Elsevier Interactive Patient Education  2018 [employer]. Colon Polyps Polyps are tissue growths inside the body. Polyps can grow in many places, including the large intestine (colon). A polyp may be a round bump or a mushroom-shaped growth. You could have one polyp or several. Most colon polyps are noncancerous (benign). However, some colon polyps can become cancerous over time. What are the causes? The exact cause of colon polyps is not known. What increases the risk? This condition is more likely to develop in people who:  Have a family history  of colon cancer or colon polyps.  Are older than 20 or older than 45 if they are African American.  Have inflammatory bowel disease, such as ulcerative colitis or Crohn disease.  Are overweight.  Smoke cigarettes.  Do not get enough exercise.  Drink too much alcohol.  Eat a diet that is: ? High in fat and red meat. ? Low in fiber.  Had childhood cancer that was treated with abdominal radiation.  What are the signs or symptoms? Most polyps do not cause symptoms. If you have symptoms, they may include:  Blood coming from your rectum when having a bowel movement.  Blood in your stool.The stool may look dark red or black.  A change in bowel habits, such as constipation or diarrhea.  How is this diagnosed? This condition is  diagnosed with a colonoscopy. This is a procedure that uses a lighted, flexible scope to look at the inside of your colon. How is this treated? Treatment for this condition involves removing any polyps that are found. Those polyps will then be tested for cancer. If cancer is found, your health care provider will talk to you about options for colon cancer treatment. Follow these instructions at home: Diet  Eat plenty of fiber, such as fruits, vegetables, and whole grains.  Eat foods that are high in calcium and vitamin D, such as milk, cheese, yogurt, eggs, liver, fish, and broccoli.  Limit foods high in fat, red meats, and processed meats, such as hot dogs, sausage, bacon, and lunch meats.  Maintain a healthy weight, or lose weight if recommended by your health care provider. General instructions  Do not smoke cigarettes.  Do not drink alcohol excessively.  Keep all follow-up visits as told by your health care provider. This is important. This includes keeping regularly scheduled colonoscopies. Talk to your health care provider about when you need a colonoscopy.  Exercise every day or as told by your health care provider. Contact a health care provider if:  You have new or worsening bleeding during a bowel movement.  You have new or increased blood in your stool.  You have a change in bowel habits.  You unexpectedly lose weight. This information is not intended to replace advice given to you by your health care provider. Make sure you discuss any questions you have with your health care provider. Document Released: 12/18/2003 Document Revised: 08/29/2015 Document Reviewed: 02/11/2015 Elsevier Interactive Patient Education  2018 [employer]. No aspirin or NSAIDs for 24 hours. Resume usual medications as before.  Take pantoprazole by mouth 30 minutes before breakfast every day. Famotidine or Pepcid OTC 20 mg daily at bedtime. Benefiber 4 g by mouth daily at bedtime. High-fiber  diet. No driving for 24 hours. Physician will call with biopsy results.

## 2017-12-13 NOTE — Op Note (Signed)
[facility] Patient Name: Stefanie Sanders Procedure Date: 12/13/2017 9:14 AM MRN: [id number] Date of Birth: [birth year] Attending MD: Hildred Laser , MD CSN: [id number] Age: 57 Admit Type: Outpatient Procedure:                Upper GI endoscopy Indications:              Follow-up of gastro-esophageal reflux disease Providers:                Hildred Laser, MD, Otis Peak B. Sharon Seller, RN, Nelma Rothman, Technician Referring MD:             Chapman Fitch, MD Medicines:                Meperidine 50 mg IV, Midazolam 5 mg IV Complications:            No immediate complications. Estimated Blood Loss:     Estimated blood loss was minimal. Procedure:                Pre-Anesthesia Assessment:                           - Prior to the procedure, a History and Physical                            was performed, and patient medications and                            allergies were reviewed. The patient's tolerance of                            previous anesthesia was also reviewed. The risks                            and benefits of the procedure and the sedation                            options and risks were discussed with the patient.                            All questions were answered, and informed consent                            was obtained. Prior Anticoagulants: The patient                            last took ibuprofen 1 day prior to the procedure.                            ASA Grade Assessment: II - A patient with mild                            systemic disease. After reviewing the risks and  benefits, the patient was deemed in satisfactory                            condition to undergo the procedure.                           After obtaining informed consent, the endoscope was                            passed under direct vision. Throughout the                            procedure, the patient's blood pressure, pulse, and                        oxygen saturations were monitored continuously. The                            GIF-H190 ([id number]) scope was introduced through the                            mouth, and advanced to the second part of duodenum.                            The upper GI endoscopy was accomplished without                            difficulty. The patient tolerated the procedure                            well. Scope In: 9:20:20 AM Scope Out: 9:28:07 AM Total Procedure Duration: 0 hours 7 minutes 47 seconds  Findings:      The examined esophagus was normal.      The Z-line was regular and was found 39 cm from the incisors.      Patchy mild inflammation characterized by congestion (edema), erosions       and erythema was found in the prepyloric region of the stomach. Biopsies       were taken with a cold forceps for histology. The pathology specimen was       placed into Bottle Number 1.      The exam of the stomach was otherwise normal.      The duodenal bulb and second portion of the duodenum were normal. Impression:               - Normal esophagus.                           - Z-line regular, 39 cm from the incisors.                           - Gastritis. Biopsied.                           - Normal duodenal bulb and second portion of the  duodenum. Moderate Sedation:      Moderate (conscious) sedation was administered by the endoscopy nurse       and supervised by the endoscopist. The following parameters were       monitored: oxygen saturation, heart rate, blood pressure, CO2       capnography and response to care. Total physician intraservice time was       14 minutes. Recommendation:           - Patient has a contact number available for                            emergencies. The signs and symptoms of potential                            delayed complications were discussed with the                            patient. Return to normal activities  tomorrow.                            Written discharge instructions were provided to the                            patient.                           - High fiber diet today.                           - Continue present medications.                           - Famotidine OTC 20 mg po qhs.                           - No aspirin, ibuprofen, naproxen, or other                            non-steroidal anti-inflammatory drugs for 1 day.                           - Await pathology results. Procedure Code(s):        --- Professional ---                           [phone], Esophagogastroduodenoscopy, flexible,                            transoral; with biopsy, single or multiple                           G0500, Moderate sedation services provided by the                            same physician or other qualified health care  professional performing a gastrointestinal                            endoscopic service that sedation supports,                            requiring the presence of an independent trained                            observer to assist in the monitoring of the                            patient's level of consciousness and physiological                            status; initial 15 minutes of intra-service time;                            patient age 61 years or older (additional time may                            be reported with [phone], as appropriate) Diagnosis Code(s):        --- Professional ---                           K29.70, Gastritis, unspecified, without bleeding                           K21.9, Gastro-esophageal reflux disease without                            esophagitis CPT copyright 2017 American [employer] Association. All rights reserved. The codes documented in this report are preliminary and upon coder review may  be revised to meet current compliance requirements. Hildred Laser, MD Hildred Laser, MD 12/13/2017 10:01:13 AM This report  has been signed electronically. Number of Addenda: 0

## 2017-12-13 NOTE — H&P (Signed)
Stefanie Sanders is an 57 y.o. female.   Chief Complaint: Patient is here for EGD and colonoscopy. HPI: Patient is 57 year old Caucasian female who has over 10-year history of GERD who is been experience epigastric pain heartburn regurgitation as well as bloating.  About 2 months ago she was treated for H. pylori infection.  Her pain is improved but that the symptoms have not.  She is on pantoprazole 40 mg daily.  She denies dysphagia nausea vomiting.  She also denies melena.  She is also undergoing diagnostic colonoscopy because change in her bowel habits.  Stools are not formed anymore.  She has occasional hematochezia in the form of blood and tissue with a bowel movements.  She says she has hemorrhoids. She smokes half to 1 pack of cigarettes per day.  She states she does not smoke anymore. Family history is negative for CRC.  Past Medical History:  Diagnosis Date  . Hypercholesteremia   . Hypertension   . Neurological pain disorder         GERD  Past Surgical History:  Procedure Laterality Date  . ABDOMINAL HYSTERECTOMY    . completer hysterecrtomy    . FOOT SURGERY Left   . NECK SURGERY    . rt foot surgery      History reviewed. No pertinent family history. Social History:  reports that she has been smoking. She has never used smokeless tobacco. She reports that she drinks alcohol. She reports that she does not use drugs.  Allergies:  Allergies  Allergen Reactions  . Other Other (See Comments)    Pt states she is allergic to some type of antibiotic but does not know which one  . Penicillins Itching and Other (See Comments)    Has patient had a PCN reaction causing immediate rash, facial/tongue/throat swelling, SOB or lightheadedness with hypotension: Yes Has patient had a PCN reaction causing severe rash involving mucus membranes or skin necrosis: No Has patient had a PCN reaction that required hospitalization: No Has patient had a PCN reaction occurring within the last 10 years:  No If all of the above answers are "NO", then may proceed with Cephalosporin use.     Medications Prior to Admission  Medication Sig Dispense Refill  . albuterol (PROVENTIL HFA;VENTOLIN HFA) 108 (90 Base) MCG/ACT inhaler Inhale 2 puffs into the lungs every 6 (six) hours as needed for shortness of breath or wheezing.    . baclofen (LIORESAL) 10 MG tablet Take 10 mg by mouth 2 (two) times daily.     . Latanoprostene Bunod (VYZULTA) 0.024 % SOLN Place 1 drop into both eyes at bedtime.     . pantoprazole (PROTONIX) 40 MG tablet Take 40 mg by mouth daily.    . pregabalin (LYRICA) 100 MG capsule Take 100 mg by mouth 2 (two) times daily.    Marland Kitchen triamterene-hydrochlorothiazide (MAXZIDE-25) 37.5-25 MG tablet Take 0.5 tablets by mouth daily.     . Vitamin D, Ergocalciferol, (DRISDOL) 50000 units CAPS capsule Take 50,000 Units by mouth 2 (two) times a week.       No results found for this or any previous visit (from the past 48 hour(s)). No results found.  ROS  Blood pressure (!) 117/58, pulse 71, temperature (!) 97.5 F (36.4 C), temperature source Oral, resp. rate 19, SpO2 95 %. Physical Exam  Constitutional: She appears well-developed and well-nourished.  HENT:  Mouth/Throat: Oropharynx is clear and moist.  Eyes: Conjunctivae are normal. No scleral icterus.  Neck: No thyromegaly present.  Cardiovascular: Normal rate, regular rhythm and normal heart sounds.  No murmur heard. Respiratory: Effort normal and breath sounds normal.  GI: Soft. Bowel sounds are normal.  Musculoskeletal: She exhibits no edema.  Lymphadenopathy:    She has no cervical adenopathy.  Neurological: She is alert.  Skin: Skin is warm and dry.     Assessment/Plan Chronic GERD.  Symptom control not satisfactory with therapy. Change in bowel habits. Diagnostic EGD and colonoscopy.  Hildred Laser, MD 12/13/2017, 9:09 AM

## 2017-12-13 NOTE — Op Note (Signed)
[facility] Patient Name: Stefanie Sanders Procedure Date: 12/13/2017 9:28 AM MRN: [id number] Date of Birth: 08-17-1960 Attending MD: Hildred Laser , MD CSN: [id number] Age: 57 Admit Type: Outpatient Procedure:                Colonoscopy Indications:              Change in bowel habits Providers:                Hildred Laser, MD, Otis Peak B. Sharon Seller, RN, Nelma Rothman, Technician Referring MD:             Chapman Fitch, MD Medicines:                Midazolam 1 mg IV Complications:            No immediate complications. Estimated Blood Loss:     Estimated blood loss was minimal. Procedure:                Pre-Anesthesia Assessment:                           - Prior to the procedure, a History and Physical                            was performed, and patient medications and                            allergies were reviewed. The patient's tolerance of                            previous anesthesia was also reviewed. The risks                            and benefits of the procedure and the sedation                            options and risks were discussed with the patient.                            All questions were answered, and informed consent                            was obtained. Prior Anticoagulants: The patient                            last took ibuprofen 1 day prior to the procedure.                            ASA Grade Assessment: II - A patient with mild                            systemic disease. After reviewing the risks and  benefits, the patient was deemed in satisfactory                            condition to undergo the procedure.                           After obtaining informed consent, the colonoscope                            was passed under direct vision. Throughout the                            procedure, the patient's blood pressure, pulse, and                            oxygen saturations were  monitored continuously. The                            PCF-H190DL ([id number]) scope was introduced through                            the anus and advanced to the the cecum, identified                            by appendiceal orifice and ileocecal valve. The                            colonoscopy was performed without difficulty. The                            patient tolerated the procedure well. The quality                            of the bowel preparation was good. The ileocecal                            valve, appendiceal orifice, and rectum were                            photographed. Scope In: 9:33:17 AM Scope Out: 9:50:56 AM Scope Withdrawal Time: 0 hours 7 minutes 40 seconds  Total Procedure Duration: 0 hours 17 minutes 39 seconds  Findings:      Skin tags were found on perianal exam.      Two sessile polyps were found in the cecum. The polyps were small in       size. These were biopsied with a cold forceps for histology. The       pathology specimen was placed into Bottle Number 1.      A single small-mouthed diverticulum was found in the hepatic flexure.      A few small-mouthed diverticula were found in the sigmoid colon.      External hemorrhoids were found during retroflexion. The hemorrhoids       were medium-sized. Impression:               - Perianal skin tags found  on perianal exam.                           - Two small polyps in the cecum. Biopsied.                           - Diverticulosis at the hepatic flexure.                           - Diverticulosis in the sigmoid colon.                           - External hemorrhoids. Moderate Sedation:      Moderate (conscious) sedation was administered by the endoscopy nurse       and supervised by the endoscopist. The following parameters were       monitored: oxygen saturation, heart rate, blood pressure, CO2       capnography and response to care. Total physician intraservice time was       22  minutes. Recommendation:           - Patient has a contact number available for                            emergencies. The signs and symptoms of potential                            delayed complications were discussed with the                            patient. Return to normal activities tomorrow.                            Written discharge instructions were provided to the                            patient.                           - High fiber diet today.                           - Benefiber 4 g po qhs.                           - Continue present medications.                           - Await pathology results.                           - Repeat colonoscopy is recommended. The                            colonoscopy date will be determined after pathology                            results from today's  exam become available for                            review. Procedure Code(s):        --- Professional ---                           [phone], Colonoscopy, flexible; with biopsy, single                            or multiple                           G0500, Moderate sedation services provided by the                            same physician or other qualified health care                            professional performing a gastrointestinal                            endoscopic service that sedation supports,                            requiring the presence of an independent trained                            observer to assist in the monitoring of the                            patient's level of consciousness and physiological                            status; initial 15 minutes of intra-service time;                            patient age 41 years or older (additional time may                            be reported with [phone], as appropriate) Diagnosis Code(s):        --- Professional ---                           D12.0, Benign neoplasm of cecum                           K64.4,  Residual hemorrhoidal skin tags                           R19.4, Change in bowel habit                           K57.30, Diverticulosis of large intestine without  perforation or abscess without bleeding CPT copyright 2017 American Medical Association. All rights reserved. The codes documented in this report are preliminary and upon coder review may  be revised to meet current compliance requirements. Hildred Laser, MD Hildred Laser, MD 12/13/2017 10:06:12 AM This report has been signed electronically. Number of Addenda: 0

## 2017-12-16 ENCOUNTER — Encounter (HOSPITAL_COMMUNITY): Payer: Self-pay | Admitting: Internal Medicine

## 2018-01-18 DIAGNOSIS — H40012 Open angle with borderline findings, low risk, left eye: Secondary | ICD-10-CM | POA: Diagnosis not present

## 2018-01-18 DIAGNOSIS — H21233 Degeneration of iris (pigmentary), bilateral: Secondary | ICD-10-CM | POA: Diagnosis not present

## 2018-01-18 DIAGNOSIS — Z83511 Family history of glaucoma: Secondary | ICD-10-CM | POA: Diagnosis not present

## 2018-01-18 DIAGNOSIS — H40053 Ocular hypertension, bilateral: Secondary | ICD-10-CM | POA: Diagnosis not present

## 2018-03-21 ENCOUNTER — Ambulatory Visit (INDEPENDENT_AMBULATORY_CARE_PROVIDER_SITE_OTHER): Payer: Medicare Other | Admitting: Internal Medicine

## 2020-08-28 ENCOUNTER — Encounter (INDEPENDENT_AMBULATORY_CARE_PROVIDER_SITE_OTHER): Payer: Self-pay | Admitting: Gastroenterology

## 2020-08-28 ENCOUNTER — Telehealth (INDEPENDENT_AMBULATORY_CARE_PROVIDER_SITE_OTHER): Payer: Medicare PPO | Admitting: Gastroenterology

## 2020-08-28 ENCOUNTER — Other Ambulatory Visit: Payer: Self-pay

## 2020-08-28 DIAGNOSIS — R110 Nausea: Secondary | ICD-10-CM | POA: Diagnosis not present

## 2020-08-28 DIAGNOSIS — K5900 Constipation, unspecified: Secondary | ICD-10-CM

## 2020-08-28 DIAGNOSIS — K219 Gastro-esophageal reflux disease without esophagitis: Secondary | ICD-10-CM | POA: Diagnosis not present

## 2020-08-28 DIAGNOSIS — R0789 Other chest pain: Secondary | ICD-10-CM

## 2020-08-28 DIAGNOSIS — R079 Chest pain, unspecified: Secondary | ICD-10-CM | POA: Insufficient documentation

## 2020-08-28 MED ORDER — PROMETHAZINE HCL 12.5 MG PO TABS: 12.5000 mg | ORAL_TABLET | Freq: Four times a day (QID) | ORAL | 2 refills | Status: AC | PRN

## 2020-08-28 NOTE — Progress Notes (Signed)
Stefanie Sanders, M.D. Gastroenterology & Hepatology [facility] [address] [place], [place] [address] Primary Care Physician: Leeanne Rio, MD [address] [place]  [phone]  This is a telephone virtual visit.  It required patient-provider interaction for the medical decision making as documented below. The patient has consented and agreed to proceed with a Telehealth encounter given the current Coronavirus pandemic.  VIRTUAL VISIT NOTE Patient location: Home Provider location: Home  I will communicate my assessment and recommendations to the referring MD via EMR.  Chief Complaint: Nausea, burping, changes in stool caliber, chest pain and regurgitation  History of Present Illness: Stefanie Sanders is a 60 y.o. female with past medical history of hyperlipidemia, hypertension and neurological pain syndrome, who presents for evaluation of multiple complaints including nausea, burping, dry heaving, changes in stool caliber, chest pain and regurgitation.  Patient reports that close to a month and half ago she presented some nausea with some dry heaving. She was prescribed Zofran every 8 hours which she believes helps up to certain point but the symptoms tend to recur.  She is presenting the nausea on a daily basis, worse in the morning.Patient reports that she was smoking some marijuana after her nausea started, which she believes has helped with her symptoms. She reports she has lost 9 lb in the last month as she has decreased her food intake. She states her nausea has led to decreased food intake.  Patient states she has been burping frequently, even when she drinks water. States this started close to a year.She has been taking pantoprazole 40 mg for at least 3 years. She had adequate control of her symptoms recently, although she presented some episodes of regurgitation in the past which improved after she changed her coffee to a  type of coffee that has "less acid content".  Patient also reports that 6 weeks ago she had an episode of chest pain and discomfort which was very severe. Due to this the patient called 911 and it resolved on its own when the paramedics arrived. She has presented these episodes in the past but has not had any studies in the past.  All these episodes have been self-limited.  Patient was told in the past that the pain episodes were related to anxiety.  The patient reports that she has noticed that for the last she has noticed her stool has decreased in amount. She used to have a BM every morning. Now she is having a BM every other day. Sometimes she has 2-3 BMs per day of small amount. She has tried eating more fiber but this has not helped to move her bowels more frequently. She noticed these changes close to a month ago,  The patient denies having any  vomiting, fever, chills, hematochezia, melena, hematemesis, abdominal distention, abdominal pain, diarrhea, jaundice, pruritus .   Last EGD:2019 Normal esophagus -Gastritis. Biopsied - CHRONIC GASTRITIS WITH INTESTINAL METAPLASIA CONSISTENT WITH CHRONIC ATROPHIC GASTRITIS. Neg for HP. - Normal duodenal bulb and second portion of the duodenum.  Last Colonoscopy: 2019 Perianal skin tags found on perianal exam. - Two small polyps in the cecum. Biopsied - - SESSILE SERRATED ADENOMA. - Diverticulosis at the hepatic flexure. - Diverticulosis in the sigmoid colon. - External hemorrhoids.  Past Medical History: Past Medical History:  Diagnosis Date  . Hypercholesteremia   . Hypertension   . Neurological pain disorder     Past Surgical History: Past Surgical History:  Procedure Laterality Date  .  ABDOMINAL HYSTERECTOMY    . BIOPSY  12/13/2017   Procedure: BIOPSY;  Surgeon: Rogene Houston, MD;  [facility];  Service: Endoscopy;;  gastric  . COLONOSCOPY N/A 12/13/2017   Procedure: COLONOSCOPY;  Surgeon: Rogene Houston, MD;  [facility];  Service: Endoscopy;  Laterality: N/A;  . completer hysterecrtomy    . ESOPHAGOGASTRODUODENOSCOPY N/A 12/13/2017   Procedure: ESOPHAGOGASTRODUODENOSCOPY (EGD);  Surgeon: Rogene Houston, MD;  [facility];  Service: Endoscopy;  Laterality: N/A;  8:30  . FOOT SURGERY Left   . NECK SURGERY    . POLYPECTOMY  12/13/2017   Procedure: POLYPECTOMY;  Surgeon: Rogene Houston, MD;  [facility];  Service: Endoscopy;;  colon  . rt foot surgery      Family History:History reviewed. No pertinent family history.  Social History: Social History   Tobacco Use  Smoking Status Current Every Day Smoker  Smokeless Tobacco Never Used  Tobacco Comment   10 cigarettes a day since age 62   Social History   Substance and Sexual Activity  Alcohol Use Not Currently   Social History   Substance and Sexual Activity  Drug Use Yes  . Types: Marijuana    Allergies: Allergies  Allergen Reactions  . Other Other (See Comments)    Pt states she is allergic to some type of antibiotic but does not know which one  . Penicillins Itching and Other (See Comments)    Has patient had a PCN reaction causing immediate rash, facial/tongue/throat swelling, SOB or lightheadedness with hypotension: Yes Has patient had a PCN reaction causing severe rash involving mucus membranes or skin necrosis: No Has patient had a PCN reaction that required hospitalization: No Has patient had a PCN reaction occurring within the last 10 years: No If all of the above answers are "NO", then may proceed with Cephalosporin use.     Medications: Current Outpatient Medications  Medication Sig Dispense Refill  . albuterol (PROVENTIL HFA;VENTOLIN HFA) 108 (90 Base) MCG/ACT inhaler Inhale 2 puffs into the lungs every 6 (six) hours as needed for shortness of breath or wheezing.    . baclofen (LIORESAL) 10 MG tablet Take 10 mg by mouth 2 (two) times daily.     . Latanoprostene Bunod 0.024 % SOLN Place 1  drop into both eyes at bedtime.     . pantoprazole (PROTONIX) 40 MG tablet Take 40 mg by mouth daily.    . pregabalin (LYRICA) 100 MG capsule Take 100 mg by mouth 3 (three) times daily.    Marland Kitchen triamterene-hydrochlorothiazide (MAXZIDE-25) 37.5-25 MG tablet Take 0.5 tablets by mouth daily.     . Vitamin D, Ergocalciferol, (DRISDOL) 50000 units CAPS capsule Take 50,000 Units by mouth 2 (two) times a week.     . famotidine (PEPCID) 20 MG tablet Take 1 tablet (20 mg total) by mouth at bedtime. (Patient not taking: Reported on 08/28/2020)    . Wheat Dextrin (BENEFIBER DRINK MIX) PACK Take 4 g by mouth at bedtime. (Patient not taking: Reported on 08/28/2020)     No current facility-administered medications for this visit.    Review of Systems: GENERAL: negative for malaise, night sweats HEENT: No changes in hearing or vision, no nose bleeds or other nasal problems. NECK: Negative for lumps, goiter, pain and significant neck swelling RESPIRATORY: Negative for cough, wheezing CARDIOVASCULAR: Negative for chest pain, leg swelling, palpitations, orthopnea GI: SEE HPI MUSCULOSKELETAL: Negative for joint pain or swelling, back pain, and muscle  pain. SKIN: Negative for lesions, rash PSYCH: Negative for sleep disturbance, mood disorder and recent psychosocial stressors. HEMATOLOGY Negative for prolonged bleeding, bruising easily, and swollen nodes. ENDOCRINE: Negative for cold or heat intolerance, polyuria, polydipsia and goiter. NEURO: negative for tremor, gait imbalance, syncope and seizures. The remainder of the review of systems is noncontributory.   Physical Exam: No exam was performed as this was a telephone encounter  Imaging/Labs: as above  I personally reviewed and interpreted the available labs, imaging and endoscopic files.  Impression and Plan: Eran Windish is a 60 y.o. female with past medical history of hyperlipidemia, hypertension and neurological pain syndrome, who presents for  evaluation of multiple complaints including nausea, burping, dry heaving, changes in stool caliber, chest pain and regurgitation.  In terms of her nausea, it is unclear why she has presented the symptoms although it could be related to her worsening constipation which started at a similar time.  I consider she will benefit from starting a bowel regimen with MiraLAX every day to improve her bowel movement frequency.  If this fails to improve her bowel movements, will need to do other investigations such as checking her TSH, but also will need to escalate to a stronger laxative.  Nevertheless, given the persistence of her nausea, we will investigate this further with an EGD.  She may benefit of trying Phenergan as needed if the Zofran is not helpful to relieve her symptoms.  I explained to the patient that her episodes of burping are likely related to aerophagia, I advised her to take more time to eat her food so she avoids inadvertently swallowed more air.  Overall, her symptoms of GERD are well controlled with her medications at the moment and no further changes are warranted at this point.  In terms of her chest pain, we will investigate this further with an EGD but I explained to her that if the esophagogastroduodenospy is not diagnostic, we will need to proceed with an esophageal manometry.  Finally, I advised her to be in the low FODMAP diet as these may provide some symptom relief.  Patient understood and agreed.  - Patient was counseled about the benefit of implementing a low FODMAP to improve symptoms and recurrent episodes. A dietary list was provided to the patient.  - Schedule EGD - Can take Phenergan as needed for nausea if Zofran does not help - Continue pantoprazole 40 mg qday - Start taking Miralax 1 cap every day for one week. If bowel movements do not improve, increase to 1 cup every 12 hours. If after two weeks there is no improvement, increase to 1 cup every 8 hours - Consider esophageal  manometry if EGD is unremarkable - RTC 3 months  All questions were answered.      Total visit time: I spent a total of  35 minutes  Stefanie Peppers, MD Gastroenterology and Hepatology [facility]

## 2020-08-28 NOTE — Patient Instructions (Addendum)
Patient was counseled about the benefit of implementing a low FODMAP to improve symptoms and recurrent episodes. A dietary list was provided to the patient.  Schedule EGD Can take Phenergan as needed for nausea if Zofran does not help Continue pantoprazole 40 mg qday Start taking Miralax 1 cap every day for one week. If bowel movements do not improve, increase to 1 cup every 12 hours. If after two weeks there is no improvement, increase to 1 cup every 8 hours

## 2020-08-29 ENCOUNTER — Other Ambulatory Visit (INDEPENDENT_AMBULATORY_CARE_PROVIDER_SITE_OTHER): Payer: Self-pay

## 2020-08-29 ENCOUNTER — Encounter (INDEPENDENT_AMBULATORY_CARE_PROVIDER_SITE_OTHER): Payer: Self-pay

## 2020-08-29 DIAGNOSIS — R110 Nausea: Secondary | ICD-10-CM

## 2020-08-29 DIAGNOSIS — T502X5A Adverse effect of carbonic-anhydrase inhibitors, benzothiadiazides and other diuretics, initial encounter: Secondary | ICD-10-CM

## 2020-09-06 ENCOUNTER — Encounter (INDEPENDENT_AMBULATORY_CARE_PROVIDER_SITE_OTHER): Payer: Self-pay

## 2020-09-30 LAB — BASIC METABOLIC PANEL
BUN: 15 mg/dL (ref 7–25)
CO2: 30 mmol/L (ref 20–32)
Calcium: 10.5 mg/dL — ABNORMAL HIGH (ref 8.6–10.4)
Chloride: 104 mmol/L (ref 98–110)
Creat: 0.75 mg/dL (ref 0.50–1.05)
Glucose, Bld: 98 mg/dL (ref 65–99)
Potassium: 5.6 mmol/L — ABNORMAL HIGH (ref 3.5–5.3)
Sodium: 140 mmol/L (ref 135–146)

## 2020-10-01 ENCOUNTER — Ambulatory Visit (HOSPITAL_COMMUNITY)
Admission: RE | Admit: 2020-10-01 | Discharge: 2020-10-01 | Disposition: A | Discharge status: Home / Self Care | Payer: Medicare PPO | Attending: Gastroenterology | Admitting: Gastroenterology

## 2020-10-01 ENCOUNTER — Ambulatory Visit (HOSPITAL_COMMUNITY): Payer: Medicare PPO | Admitting: Anesthesiology

## 2020-10-01 ENCOUNTER — Encounter (HOSPITAL_COMMUNITY): Payer: Self-pay | Admitting: Gastroenterology

## 2020-10-01 ENCOUNTER — Encounter (HOSPITAL_COMMUNITY)
Admission: RE | Disposition: A | Discharge status: Home / Self Care | Payer: Self-pay | Source: Home / Self Care | Attending: Gastroenterology

## 2020-10-01 ENCOUNTER — Other Ambulatory Visit: Payer: Self-pay

## 2020-10-01 DIAGNOSIS — F17200 Nicotine dependence, unspecified, uncomplicated: Secondary | ICD-10-CM | POA: Diagnosis not present

## 2020-10-01 DIAGNOSIS — K222 Esophageal obstruction: Secondary | ICD-10-CM | POA: Insufficient documentation

## 2020-10-01 DIAGNOSIS — Z880 Allergy status to penicillin: Secondary | ICD-10-CM | POA: Diagnosis not present

## 2020-10-01 DIAGNOSIS — M792 Neuralgia and neuritis, unspecified: Secondary | ICD-10-CM | POA: Diagnosis not present

## 2020-10-01 DIAGNOSIS — Z79899 Other long term (current) drug therapy: Secondary | ICD-10-CM | POA: Insufficient documentation

## 2020-10-01 DIAGNOSIS — E7800 Pure hypercholesterolemia, unspecified: Secondary | ICD-10-CM | POA: Diagnosis not present

## 2020-10-01 DIAGNOSIS — I10 Essential (primary) hypertension: Secondary | ICD-10-CM | POA: Insufficient documentation

## 2020-10-01 DIAGNOSIS — K449 Diaphragmatic hernia without obstruction or gangrene: Secondary | ICD-10-CM | POA: Insufficient documentation

## 2020-10-01 DIAGNOSIS — K5900 Constipation, unspecified: Secondary | ICD-10-CM | POA: Diagnosis not present

## 2020-10-01 DIAGNOSIS — K5710 Diverticulosis of small intestine without perforation or abscess without bleeding: Secondary | ICD-10-CM | POA: Diagnosis not present

## 2020-10-01 DIAGNOSIS — R110 Nausea: Secondary | ICD-10-CM

## 2020-10-01 DIAGNOSIS — Z7951 Long term (current) use of inhaled steroids: Secondary | ICD-10-CM | POA: Insufficient documentation

## 2020-10-01 DIAGNOSIS — Z888 Allergy status to other drugs, medicaments and biological substances status: Secondary | ICD-10-CM | POA: Diagnosis not present

## 2020-10-01 DIAGNOSIS — Z791 Long term (current) use of non-steroidal anti-inflammatories (NSAID): Secondary | ICD-10-CM | POA: Insufficient documentation

## 2020-10-01 HISTORY — PX: BIOPSY: SHX5522

## 2020-10-01 HISTORY — PX: ESOPHAGOGASTRODUODENOSCOPY (EGD) WITH PROPOFOL: SHX5813

## 2020-10-01 SURGERY — ESOPHAGOGASTRODUODENOSCOPY (EGD) WITH PROPOFOL
Anesthesia: General

## 2020-10-01 MED ORDER — LACTATED RINGERS IV SOLN: INTRAVENOUS | Status: DC

## 2020-10-01 MED ORDER — LIDOCAINE HCL (CARDIAC) PF 100 MG/5ML IV SOSY
PREFILLED_SYRINGE | INTRAVENOUS | Status: DC | PRN
  Administered 2020-10-01: 50 mg via INTRAVENOUS

## 2020-10-01 MED ORDER — PROPOFOL 10 MG/ML IV BOLUS
INTRAVENOUS | Status: DC | PRN
  Administered 2020-10-01: 120 mg via INTRAVENOUS
  Administered 2020-10-01: 20 mg via INTRAVENOUS

## 2020-10-01 NOTE — Anesthesia Postprocedure Evaluation (Signed)
Anesthesia Post Note  Patient: Stefanie Sanders  Procedure(s) Performed: ESOPHAGOGASTRODUODENOSCOPY (EGD) WITH PROPOFOL BIOPSY  Patient location during evaluation: Endoscopy Anesthesia Type: General Level of consciousness: awake and alert and oriented Pain management: pain level controlled Vital Signs Assessment: post-procedure vital signs reviewed and stable Respiratory status: spontaneous breathing and respiratory function stable Cardiovascular status: blood pressure returned to baseline and stable Postop Assessment: no apparent nausea or vomiting Anesthetic complications: no   No notable events documented.   Last Vitals:  Vitals:   10/01/20 0935 10/01/20 1139  BP: 114/74 (!) 100/51  Pulse: 69   Resp: 17 16  Temp: 36.6 C 36.6 C  SpO2: 94% 95%    Last Pain:  Vitals:   10/01/20 1139  TempSrc: Oral  PainSc: 0-No pain                 Jayke Caul C Laurann Mcmorris

## 2020-10-01 NOTE — H&P (Signed)
Stefanie Chenevert is an 60 y.o. female.   Chief Complaint: Nausea and diarrhea HPI: Stefanie Sanders is a 60 y.o. female with past medical history of hyperlipidemia, hypertension and neurological pain syndrome, who presents for evaluation of nausea and diarrhea.  Patient reports intermittent episodes of nausea in the past for which she has used Phenergan with improvement of her symptoms.  Denies having any abdominal pain but has had intermittent episodes of diarrhea and constipation with occasional bloating.  Denies any fever, chills, melena or hematochezia.   Past Medical History:  Diagnosis Date   Hypercholesteremia    Hypertension    Neurological pain disorder     Past Surgical History:  Procedure Laterality Date   ABDOMINAL HYSTERECTOMY     BIOPSY  12/13/2017   Procedure: BIOPSY;  Surgeon: Rogene Houston, MD;  [facility];  Service: Endoscopy;;  gastric   COLONOSCOPY N/A 12/13/2017   Procedure: COLONOSCOPY;  Surgeon: Rogene Houston, MD;  [facility];  Service: Endoscopy;  Laterality: N/A;   completer hysterecrtomy     ESOPHAGOGASTRODUODENOSCOPY N/A 12/13/2017   Procedure: ESOPHAGOGASTRODUODENOSCOPY (EGD);  Surgeon: Rogene Houston, MD;  [facility];  Service: Endoscopy;  Laterality: N/A;  8:30   FOOT SURGERY Left    NECK SURGERY     POLYPECTOMY  12/13/2017   Procedure: POLYPECTOMY;  Surgeon: Rogene Houston, MD;  [facility];  Service: Endoscopy;;  colon   rt foot surgery      History reviewed. No pertinent family history. Social History:  reports that she has been smoking. She has never used smokeless tobacco. She reports previous alcohol use. She reports current drug use. Drug: Marijuana.  Allergies:  Allergies  Allergen Reactions   Amitriptyline     Black out spells   Ezetimibe     Muscle Pain   Fenofibrate     Muscle Pain   Penicillins Itching and Other (See Comments)    Has patient had a PCN reaction causing immediate rash,  facial/tongue/throat swelling, SOB or lightheadedness with hypotension: Yes Has patient had a PCN reaction causing severe rash involving mucus membranes or skin necrosis: No Has patient had a PCN reaction that required hospitalization: No Has patient had a PCN reaction occurring within the last 10 years: No If all of the above answers are "NO", then may proceed with Cephalosporin use.     Medications Prior to Admission  Medication Sig Dispense Refill   albuterol (PROVENTIL HFA;VENTOLIN HFA) 108 (90 Base) MCG/ACT inhaler Inhale 2 puffs into the lungs every 6 (six) hours as needed for shortness of breath or wheezing.     baclofen (LIORESAL) 10 MG tablet Take 10 mg by mouth 3 (three) times daily.     budesonide-formoterol (SYMBICORT) 80-4.5 MCG/ACT inhaler Inhale 2 puffs into the lungs 2 (two) times daily.     clindamycin (CLEOCIN) 150 MG capsule Take 150 mg by mouth 3 (three) times daily.     hydrocortisone ointment 0.5 % Apply 1 application topically 2 (two) times daily as needed for itching.     ibuprofen (ADVIL) 200 MG tablet Take 400-600 mg by mouth every 6 (six) hours as needed for headache or mild pain.     ibuprofen (ADVIL) 800 MG tablet Take 800 mg by mouth every 8 (eight) hours as needed for moderate pain.     Latanoprostene Bunod 0.024 % SOLN Place 2 drops into both eyes at bedtime.     Multiple Vitamins-Minerals (MULTIVITAMIN GUMMIES ADULT  PO) Take 2 capsules by mouth daily.     Omega-3 Fatty Acids (FISH OIL) 1000 MG CAPS Take 1,000 mg by mouth 2 (two) times daily.     OVER THE COUNTER MEDICATION Take 0.5-1 capsules by mouth at bedtime as needed (sleep). Cbd gummies     pantoprazole (PROTONIX) 40 MG tablet Take 40 mg by mouth daily.     pregabalin (LYRICA) 100 MG capsule Take 100-200 mg by mouth See admin instructions. Take 100 mg in the morning and 200 mg at night     promethazine (PHENERGAN) 12.5 MG tablet Take 1 tablet (12.5 mg total) by mouth every 6 (six) hours as needed for  refractory nausea / vomiting. 30 tablet 2   triamterene-hydrochlorothiazide (MAXZIDE-25) 37.5-25 MG tablet Take 0.5 tablets by mouth daily.      Vitamin D, Ergocalciferol, (DRISDOL) 50000 units CAPS capsule Take 50,000 Units by mouth every Tuesday.      Results for orders placed or performed in visit on 08/29/20 (from the past 48 hour(s))  Basic metabolic panel     Status: Abnormal   Collection Time: 09/30/20  8:37 AM  Result Value Ref Range   Glucose, Bld 98 65 - 99 mg/dL    Comment: .            Fasting reference interval .    BUN 15 7 - 25 mg/dL   Creat 0.75 0.50 - 1.05 mg/dL    Comment: For patients >41 years of age, the reference limit for Creatinine is approximately 13% higher for people identified as African-American. .    BUN/Creatinine Ratio NOT APPLICABLE 6 - 22 (calc)   Sodium 140 135 - 146 mmol/L   Potassium 5.6 (H) 3.5 - 5.3 mmol/L   Chloride 104 98 - 110 mmol/L   CO2 30 20 - 32 mmol/L   Calcium 10.5 (H) 8.6 - 10.4 mg/dL   No results found.  Review of Systems  Constitutional: Negative.   HENT: Negative.    Eyes: Negative.   Respiratory: Negative.    Cardiovascular: Negative.   Gastrointestinal:  Positive for diarrhea and nausea.  Endocrine: Negative.   Genitourinary: Negative.   Musculoskeletal: Negative.   Skin: Negative.   Allergic/Immunologic: Negative.   Neurological: Negative.   Hematological: Negative.   Psychiatric/Behavioral: Negative.     Blood pressure 114/74, pulse 69, temperature 97.8 F (36.6 C), temperature source Oral, resp. rate 17, height 5\' 6"  (1.676 m), weight 62.1 kg, SpO2 94 %. Physical Exam  GENERAL: The patient is AO x3, in no acute distress. HEENT: Head is normocephalic and atraumatic. EOMI are intact. Mouth is well hydrated and without lesions. NECK: Supple. No masses LUNGS: Clear to auscultation. No presence of rhonchi/wheezing/rales. Adequate chest expansion HEART: RRR, normal s1 and s2. ABDOMEN: Soft, nontender, no  guarding, no peritoneal signs, and nondistended. BS +. No masses. EXTREMITIES: Without any cyanosis, clubbing, rash, lesions or edema. NEUROLOGIC: AOx3, no focal motor deficit. SKIN: no jaundice, no rashes  Assessment/Plan Caitlen Worth is a 60 y.o. female with past medical history of hyperlipidemia, hypertension and neurological pain syndrome, who presents for evaluation of nausea and diarrhea.  We will proceed with an EGD.  Harvel Quale, MD 10/01/2020, 11:07 AM

## 2020-10-01 NOTE — Discharge Instructions (Addendum)
You are being discharged to home.  Resume your previous diet.  We are waiting for your pathology results.  

## 2020-10-01 NOTE — Transfer of Care (Signed)
Immediate Anesthesia Transfer of Care Note  Patient: Stefanie Sanders  Procedure(s) Performed: ESOPHAGOGASTRODUODENOSCOPY (EGD) WITH PROPOFOL BIOPSY  Patient Location: Endoscopy Unit  Anesthesia Type:General  Level of Consciousness: awake, alert  and oriented  Airway & Oxygen Therapy: Patient Spontanous Breathing  Post-op Assessment: Report given to RN and Post -op Vital signs reviewed and stable  Post vital signs: Reviewed and stable  Last Vitals:  Vitals Value Taken Time  BP    Temp    Pulse    Resp    SpO2      Last Pain:  Vitals:   10/01/20 1123  TempSrc:   PainSc: 0-No pain      Patients Stated Pain Goal: 8 (42/[id number])  Complications: No notable events documented.

## 2020-10-01 NOTE — Anesthesia Preprocedure Evaluation (Addendum)
Anesthesia Evaluation  Patient identified by MRN, date of birth, ID band Patient awake    Reviewed: Allergy & Precautions, NPO status , Patient's Chart, lab work & pertinent test results  Airway Mallampati: II  TM Distance: >3 FB Neck ROM: Full    Dental  (+) Dental Advisory Given, Missing   Pulmonary shortness of breath and with exertion, COPD, Current SmokerPatient did not abstain from smoking.,    Pulmonary exam normal breath sounds clear to auscultation       Cardiovascular Exercise Tolerance: Good hypertension, Pt. on medications Normal cardiovascular exam Rhythm:Regular Rate:Normal     Neuro/Psych negative psych ROS   GI/Hepatic GERD  Medicated and Controlled,(+)     substance abuse  marijuana use,   Endo/Other  negative endocrine ROS  Renal/GU negative Renal ROS     Musculoskeletal  (+) Fibromyalgia -  Abdominal   Peds  Hematology negative hematology ROS (+)   Anesthesia Other Findings   Reproductive/Obstetrics negative OB ROS                             Anesthesia Physical Anesthesia Plan  ASA: 2  Anesthesia Plan: General   Post-op Pain Management:    Induction: Intravenous  PONV Risk Score and Plan: Propofol infusion  Airway Management Planned: Nasal Cannula and Natural Airway  Additional Equipment:   Intra-op Plan:   Post-operative Plan:   Informed Consent: I have reviewed the patients History and Physical, chart, labs and discussed the procedure including the risks, benefits and alternatives for the proposed anesthesia with the patient or authorized representative who has indicated his/her understanding and acceptance.     Dental advisory given  Plan Discussed with: CRNA and Surgeon  Anesthesia Plan Comments:        Anesthesia Quick Evaluation

## 2020-10-01 NOTE — Op Note (Signed)
[facility] Patient Name: Stefanie Sanders Procedure Date: 10/01/2020 11:12 AM MRN: [id number] Date of Birth: 1960/05/29 Attending MD: Maylon Peppers ,  CSN: [id number] Age: 60 Admit Type: Outpatient Procedure:                Upper GI endoscopy Indications:              Nausea Providers:                Maylon Peppers, [place] Sharon Seller, RN, Raphael Gibney, Technician Referring MD:              Medicines:                Monitored Anesthesia Care Complications:            No immediate complications. Estimated Blood Loss:     Estimated blood loss: none. Procedure:                Pre-Anesthesia Assessment:                           - Prior to the procedure, a History and Physical                            was performed, and patient medications, allergies                            and sensitivities were reviewed. The patient's                            tolerance of previous anesthesia was reviewed.                           - The risks and benefits of the procedure and the                            sedation options and risks were discussed with the                            patient. All questions were answered and informed                            consent was obtained.                           - ASA Grade Assessment: II - A patient with mild                            systemic disease.                           After obtaining informed consent, the endoscope was                            passed under direct vision. Throughout the  procedure, the patient's blood pressure, pulse, and                            oxygen saturations were monitored continuously. The                            GIF-H190 ([id number]) scope was introduced through the                            mouth, and advanced to the second part of duodenum.                            The upper GI endoscopy was accomplished without                             difficulty. The patient tolerated the procedure                            well. Scope In: 11:28:38 AM Scope Out: 11:34:34 AM Total Procedure Duration: 0 hours 5 minutes 56 seconds  Findings:      A widely patent and non-obstructing Schatzki ring was found at the       gastroesophageal junction. This was disrupted with a cold forceps.      A 1 cm hiatal hernia was present.      The entire examined stomach was normal.      The examined duodenum was normal. Biopsies were taken with a cold       forceps for histology.      A medium non-bleeding diverticulum was found in the second portion of       the duodenum. Impression:               - Widely patent and non-obstructing Schatzki ring.                            This was disrupted with a cold forceps.                           - 1 cm hiatal hernia.                           - Normal stomach.                           - Normal examined duodenum. Biopsied.                           - Non-bleeding duodenal diverticulum. Moderate Sedation:      Per Anesthesia Care Recommendation:           - Discharge patient to home (ambulatory).                           - Resume previous diet.                           - Await pathology results. Procedure Code(s):        ---  Professional ---                           [phone], Esophagogastroduodenoscopy, flexible,                            transoral; with biopsy, single or multiple Diagnosis Code(s):        --- Professional ---                           K22.2, Esophageal obstruction                           K44.9, Diaphragmatic hernia without obstruction or                            gangrene                           R11.0, Nausea                           K57.10, Diverticulosis of small intestine without                            perforation or abscess without bleeding CPT copyright 2019 [employer]. All rights reserved. The codes documented in this report are preliminary and upon  coder review may  be revised to meet current compliance requirements. Maylon Peppers, MD Maylon Peppers,  10/01/2020 11:45:23 AM This report has been signed electronically. Number of Addenda: 0

## 2020-10-02 LAB — SURGICAL PATHOLOGY

## 2020-10-08 ENCOUNTER — Encounter (HOSPITAL_COMMUNITY): Payer: Self-pay | Admitting: Gastroenterology

## 2020-12-02 ENCOUNTER — Ambulatory Visit (INDEPENDENT_AMBULATORY_CARE_PROVIDER_SITE_OTHER): Payer: Medicare PPO | Admitting: Gastroenterology

## 2021-10-14 ENCOUNTER — Institutional Professional Consult (permissible substitution): Payer: Medicare PPO | Admitting: Internal Medicine
